# Patient Record
Sex: Male | Born: 1965 | Hispanic: No | Marital: Single | Smoking: Never smoker
Health system: Southern US, Community
[De-identification: ages and names within clinical notes are randomized; demographics above are authoritative.]

## PROBLEM LIST (undated history)

## (undated) DIAGNOSIS — G4730 Sleep apnea, unspecified: Secondary | ICD-10-CM

## (undated) DIAGNOSIS — J45909 Unspecified asthma, uncomplicated: Secondary | ICD-10-CM

## (undated) HISTORY — PX: HERNIA REPAIR: SHX51

---

## 2006-12-09 ENCOUNTER — Encounter: Admission: RE | Admit: 2006-12-09 | Discharge: 2006-12-09 | Payer: Self-pay | Admitting: Nephrology

## 2017-06-19 ENCOUNTER — Emergency Department (HOSPITAL_COMMUNITY): Payer: Self-pay

## 2017-06-19 ENCOUNTER — Encounter (HOSPITAL_COMMUNITY): Payer: Self-pay | Admitting: Emergency Medicine

## 2017-06-19 ENCOUNTER — Emergency Department (HOSPITAL_COMMUNITY)
Admission: EM | Admit: 2017-06-19 | Discharge: 2017-06-19 | Disposition: A | Discharge status: Home / Self Care | Payer: Self-pay | Attending: Emergency Medicine | Admitting: Emergency Medicine

## 2017-06-19 DIAGNOSIS — Y939 Activity, unspecified: Secondary | ICD-10-CM | POA: Insufficient documentation

## 2017-06-19 DIAGNOSIS — S61511A Laceration without foreign body of right wrist, initial encounter: Secondary | ICD-10-CM

## 2017-06-19 DIAGNOSIS — Y929 Unspecified place or not applicable: Secondary | ICD-10-CM | POA: Insufficient documentation

## 2017-06-19 DIAGNOSIS — Y990 Civilian activity done for income or pay: Secondary | ICD-10-CM | POA: Insufficient documentation

## 2017-06-19 DIAGNOSIS — S65911A Laceration of unspecified blood vessel at wrist and hand level of right arm, initial encounter: Secondary | ICD-10-CM | POA: Insufficient documentation

## 2017-06-19 DIAGNOSIS — J45909 Unspecified asthma, uncomplicated: Secondary | ICD-10-CM | POA: Insufficient documentation

## 2017-06-19 DIAGNOSIS — W268XXA Contact with other sharp object(s), not elsewhere classified, initial encounter: Secondary | ICD-10-CM | POA: Insufficient documentation

## 2017-06-19 DIAGNOSIS — Z23 Encounter for immunization: Secondary | ICD-10-CM | POA: Insufficient documentation

## 2017-06-19 HISTORY — DX: Unspecified asthma, uncomplicated: J45.909

## 2017-06-19 MED ORDER — LIDOCAINE-EPINEPHRINE (PF) 2 %-1:200000 IJ SOLN
20.0000 mL | Freq: Once | INTRAMUSCULAR | Status: AC
  Administered 2017-06-19: 20 mL via INTRADERMAL

## 2017-06-19 MED ORDER — TETANUS-DIPHTH-ACELL PERTUSSIS 5-2.5-18.5 LF-MCG/0.5 IM SUSP
0.5000 mL | Freq: Once | INTRAMUSCULAR | Status: AC
  Administered 2017-06-19: 0.5 mL via INTRAMUSCULAR
  Filled 2017-06-19: qty 0.5

## 2017-06-19 MED ORDER — OXYCODONE-ACETAMINOPHEN 5-325 MG PO TABS
1.0000 | ORAL_TABLET | Freq: Once | ORAL | Status: AC
  Administered 2017-06-19: 1 via ORAL
  Filled 2017-06-19: qty 1

## 2017-06-19 MED ORDER — LIDOCAINE-EPINEPHRINE (PF) 1 %-1:200000 IJ SOLN: 20.0000 mL | Freq: Once | INTRAMUSCULAR | Status: DC

## 2017-06-19 MED ORDER — HYDROCODONE-ACETAMINOPHEN 5-325 MG PO TABS: 1.0000 | ORAL_TABLET | Freq: Four times a day (QID) | ORAL | 0 refills | Status: AC | PRN

## 2017-06-19 NOTE — ED Provider Notes (Signed)
[facility] EMERGENCY DEPARTMENT Provider Note   CSN: [id number] Arrival date & time: 06/19/17  0033     History   Chief Complaint Chief Complaint  Patient presents with  . Laceration    HPI Aaron Arias is a 52 y.o. male.  HPI  This is a 52 year old male who presents with an injury to the right wrist.  Patient brought in by EMS with tourniquet in place.  Patient reports that he was at work when a sharp instrument used to cut rubber cut his right wrist.  He is left-handed.  EMS reports brisk bleeding from the right wrist.  Tourniquet was placed at 1 AM.  Patient is currently complaining of pain and numbness in the hand.  Denies any other injury.  Unknown last tetanus shot.  Past Medical History:  Diagnosis Date  . Asthma     There are no active problems to display for this patient.   Past Surgical History:  Procedure Laterality Date  . HERNIA REPAIR         Home Medications    Prior to Admission medications   Medication Sig Start Date End Date Taking? Authorizing Provider  HYDROcodone-acetaminophen (NORCO/VICODIN) 5-325 MG tablet Take 1 tablet by mouth every 6 (six) hours as needed. 06/19/17   Azlan Hanway, Mayer Masker, MD    Family History No family history on file.  Social History Social History   Tobacco Use  . Smoking status: Never Smoker  . Smokeless tobacco: Never Used  Substance Use Topics  . Alcohol use: No    Frequency: Never  . Drug use: No     Allergies   Patient has no known allergies.   Review of Systems Review of Systems  Musculoskeletal:       Right wrist pain  Skin: Positive for wound.  Neurological: Positive for numbness. Negative for weakness.  All other systems reviewed and are negative.    Physical Exam Updated Vital Signs BP 110/77   Pulse 76   Temp 98.4 F (36.9 C) (Oral)   Resp 18   Ht 6\' 4"  (1.93 m)   Wt (!) 149.7 kg (330 lb)   SpO2 98%   BMI 40.17 kg/m   Physical Exam  Constitutional: He is  oriented to person, place, and time. He appears well-developed and well-nourished.  Obese, no acute distress  HENT:  Head: Normocephalic and atraumatic.  Cardiovascular: Normal rate and regular rhythm.  Pulmonary/Chest: Effort normal. No respiratory distress.  Musculoskeletal: He exhibits no edema.  Focused examination of the right wrist reveals a 10 cm laceration over the palmar aspect of the wrist, tourniquet in place in the mid forearm with blue discoloration distally, oozing noted from the distal aspect of the wound, no pulsation noted, normal flexion and extension is at the risk and all 5 fingers, sensation to light touch intact  Neurological: He is alert and oriented to person, place, and time.  Skin: Skin is warm and dry.  Psychiatric: He has a normal mood and affect.  Nursing note and vitals reviewed.    ED Treatments / Results  Labs (all labs ordered are listed, but only abnormal results are displayed) Labs Reviewed - No data to display  EKG  EKG Interpretation None       Radiology Dg Wrist Complete Right  Result Date: 06/19/2017 CLINICAL DATA:  Right wrist injury with laceration. EXAM: RIGHT WRIST - COMPLETE 3+ VIEW COMPARISON:  None. FINDINGS: There is no evidence of fracture or dislocation. The alignment  and joint spaces are maintained. Degenerative cystic change in the scaphoid. Possible carpal boss, incidental. Laceration noted about the volar radial wrist with skin defect and edema. No radiopaque foreign body. IMPRESSION: Soft tissue laceration without radiopaque foreign body or acute osseous abnormality. Electronically Signed   By: Rubye OaksMelanie  Ehinger M.D.   On: 06/19/2017 01:34    Procedures .Marland Kitchen.Laceration Repair Date/Time: 06/19/2017 2:01 AM Performed by: Shon BatonHorton, Kyzer Blowe F, MD Authorized by: Shon BatonHorton, Alpheus Stiff F, MD   Consent:    Consent obtained:  Verbal   Consent given by:  Patient   Risks discussed:  Infection, pain, need for additional repair, nerve damage and  vascular damage   Alternatives discussed:  No treatment Anesthesia (see MAR for exact dosages):    Anesthesia method:  Local infiltration   Local anesthetic:  Lidocaine 2% WITH epi Laceration details:    Location:  Hand   Hand location:  R wrist   Length (cm):  10   Depth (mm):  3 Repair type:    Repair type:  Complex Pre-procedure details:    Preparation:  Patient was prepped and draped in usual sterile fashion and imaging obtained to evaluate for foreign bodies Exploration:    Limited defect created (wound extended): no     Hemostasis achieved with:  Epinephrine, tied off vessels, direct pressure and tourniquet   Wound exploration: wound explored through full range of motion and entire depth of wound probed and visualized     Wound extent: vascular damage     Wound extent: no muscle damage noted, no nerve damage noted and no tendon damage noted     Contaminated: no   Treatment:    Area cleansed with:  Betadine and saline   Amount of cleaning:  Extensive   Irrigation solution:  Sterile saline   Irrigation volume:  1 L   Irrigation method:  Pressure wash   Visualized foreign bodies/material removed: no     Debridement:  None   Undermining:  None   Scar revision: no   Skin repair:    Repair method:  Sutures   Suture size:  4-0   Suture material:  Nylon   Suture technique:  Simple interrupted   Number of sutures:  9 Approximation:    Approximation:  Close   Vermilion border: well-aligned   Post-procedure details:    Dressing: Pressure dressing.   Patient tolerance of procedure:  Tolerated well, no immediate complications Comments:     Bleeders ligated both distally and proximally in the wound, likely venous, ligated with 3-0 Vicryl figure-of-eight stitch x2, skin repair as above   (including critical care time)  Medications Ordered in ED Medications  lidocaine-EPINEPHrine (XYLOCAINE W/EPI) 2 %-1:200000 (PF) injection 20 mL (20 mLs Intradermal Given 06/19/17 0045)  Tdap  (BOOSTRIX) injection 0.5 mL (0.5 mLs Intramuscular Given 06/19/17 0127)  oxyCODONE-acetaminophen (PERCOCET/ROXICET) 5-325 MG per tablet 1 tablet (1 tablet Oral Given 06/19/17 0127)     Initial Impression / Assessment and Plan / ED Course  I have reviewed the triage vital signs and the nursing notes.  Pertinent labs & imaging results that were available during my care of the patient were reviewed by me and considered in my medical decision making (see chart for details).     Patient presents with an injury to the right wrist.  Tourniquet in place.  He does have some brisk what appears to be venous bleeding distally in the wound.  This was ligated with good bleeding control.  Lidocaine with epinephrine  was also injected.  Tourniquet was taken down and no additional bleeding was noted from the wound.  Wound was probed and explored without any evidence of tendon rupture or damage.  Patient has good neurovascular exam distally.  Ulnar and radial pulses are intact.  Venous bleeder appeared to be more in the midline.  See wound repair above.  On repeat examination, no recurrent bleeding noted through the bandage.  Recommend hand follow-up.  Patient is reporting some numbness in the fourth and fifth digits but continues to have good strength, flexion, extension, and capillary refill.  After history, exam, and medical workup I feel the patient has been appropriately medically screened and is safe for discharge home. Pertinent diagnoses were discussed with the patient. Patient was given return precautions.   Final Clinical Impressions(s) / ED Diagnoses   Final diagnoses:  Laceration of right wrist, initial encounter  Laceration of unspecified blood vessel at wrist and hand level of right arm, initial encounter    ED Discharge Orders        Ordered    HYDROcodone-acetaminophen (NORCO/VICODIN) 5-325 MG tablet  Every 6 hours PRN     06/19/17 0156       Shon Baton, MD 06/19/17 0205

## 2017-06-19 NOTE — ED Notes (Signed)
E-signature not available, pt verbalized understanding of DC instructions and prescriptions 

## 2017-06-19 NOTE — ED Triage Notes (Signed)
BIB EMS from work, pt cut R wrist on piece of metal scraper. Laceration approx 4 in, tourniquet in place. Per EMS bleeding was arterial. After EDP evaluation, bleeding appears to be venous. Received 200NS and 100 Fentanyl en route. VSS, A/OX4.

## 2017-06-19 NOTE — Discharge Instructions (Signed)
You were seen today with a laceration of the left wrist.  You had a vessel that was tied off.  You need to follow-up with hand surgery for repeat examination and suture removal.  Try to make an appointment before you leave for [place].

## 2017-06-19 NOTE — ED Notes (Signed)
EDP at bedside preparing to suture the active bleeding

## 2017-06-24 ENCOUNTER — Ambulatory Visit (HOSPITAL_COMMUNITY)
Admission: RE | Admit: 2017-06-24 | Discharge: 2017-06-24 | Disposition: A | Discharge status: Home / Self Care | Payer: Self-pay | Source: Ambulatory Visit | Attending: Orthopedic Surgery | Admitting: Orthopedic Surgery

## 2017-06-24 ENCOUNTER — Encounter (HOSPITAL_COMMUNITY): Payer: Self-pay | Admitting: Certified Registered"

## 2017-06-24 ENCOUNTER — Ambulatory Visit (HOSPITAL_COMMUNITY): Payer: Self-pay | Admitting: Certified Registered"

## 2017-06-24 ENCOUNTER — Encounter (HOSPITAL_COMMUNITY)
Admission: RE | Disposition: A | Discharge status: Home / Self Care | Payer: Self-pay | Source: Ambulatory Visit | Attending: Orthopedic Surgery

## 2017-06-24 DIAGNOSIS — S66124A Laceration of flexor muscle, fascia and tendon of right ring finger at wrist and hand level, initial encounter: Secondary | ICD-10-CM | POA: Insufficient documentation

## 2017-06-24 DIAGNOSIS — S66126A Laceration of flexor muscle, fascia and tendon of right little finger at wrist and hand level, initial encounter: Secondary | ICD-10-CM | POA: Insufficient documentation

## 2017-06-24 DIAGNOSIS — S61501A Unspecified open wound of right wrist, initial encounter: Secondary | ICD-10-CM

## 2017-06-24 DIAGNOSIS — S6401XA Injury of ulnar nerve at wrist and hand level of right arm, initial encounter: Secondary | ICD-10-CM | POA: Insufficient documentation

## 2017-06-24 DIAGNOSIS — G4730 Sleep apnea, unspecified: Secondary | ICD-10-CM | POA: Insufficient documentation

## 2017-06-24 DIAGNOSIS — X58XXXA Exposure to other specified factors, initial encounter: Secondary | ICD-10-CM | POA: Insufficient documentation

## 2017-06-24 DIAGNOSIS — S61511A Laceration without foreign body of right wrist, initial encounter: Secondary | ICD-10-CM | POA: Insufficient documentation

## 2017-06-24 DIAGNOSIS — Y990 Civilian activity done for income or pay: Secondary | ICD-10-CM | POA: Insufficient documentation

## 2017-06-24 DIAGNOSIS — S66921A Laceration of unspecified muscle, fascia and tendon at wrist and hand level, right hand, initial encounter: Secondary | ICD-10-CM

## 2017-06-24 DIAGNOSIS — Z6841 Body Mass Index (BMI) 40.0 and over, adult: Secondary | ICD-10-CM | POA: Insufficient documentation

## 2017-06-24 DIAGNOSIS — S65011A Laceration of ulnar artery at wrist and hand level of right arm, initial encounter: Secondary | ICD-10-CM | POA: Insufficient documentation

## 2017-06-24 DIAGNOSIS — Z888 Allergy status to other drugs, medicaments and biological substances status: Secondary | ICD-10-CM | POA: Insufficient documentation

## 2017-06-24 HISTORY — PX: FLEXOR TENDON REPAIR: SHX6501

## 2017-06-24 HISTORY — DX: Sleep apnea, unspecified: G47.30

## 2017-06-24 SURGERY — REPAIR, TENDON, FLEXOR
Anesthesia: General | Site: Wrist | Laterality: Right

## 2017-06-24 MED ORDER — OXYCODONE HCL 5 MG PO TABS
ORAL_TABLET | ORAL | Status: AC
  Filled 2017-06-24: qty 2

## 2017-06-24 MED ORDER — MIDAZOLAM HCL 2 MG/2ML IJ SOLN
INTRAMUSCULAR | Status: DC | PRN
  Administered 2017-06-24: 2 mg via INTRAVENOUS

## 2017-06-24 MED ORDER — LIDOCAINE HCL (CARDIAC) 20 MG/ML IV SOLN
INTRAVENOUS | Status: AC
  Filled 2017-06-24: qty 5

## 2017-06-24 MED ORDER — PROPOFOL 10 MG/ML IV BOLUS
INTRAVENOUS | Status: DC | PRN
  Administered 2017-06-24: 200 mg via INTRAVENOUS

## 2017-06-24 MED ORDER — ACETAMINOPHEN 325 MG PO TABS
325.0000 mg | ORAL_TABLET | Freq: Once | ORAL | Status: AC
  Administered 2017-06-24: 325 mg via ORAL

## 2017-06-24 MED ORDER — HYDROCODONE-ACETAMINOPHEN 7.5-325 MG PO TABS: 1.0000 | ORAL_TABLET | Freq: Once | ORAL | Status: DC | PRN

## 2017-06-24 MED ORDER — SODIUM CHLORIDE 0.9 % IV SOLN
INTRAVENOUS | Status: DC | PRN
  Administered 2017-06-24: 500 mL

## 2017-06-24 MED ORDER — FENTANYL CITRATE (PF) 250 MCG/5ML IJ SOLN
INTRAMUSCULAR | Status: AC
  Filled 2017-06-24: qty 5

## 2017-06-24 MED ORDER — PROPOFOL 10 MG/ML IV BOLUS
INTRAVENOUS | Status: AC
  Filled 2017-06-24: qty 20

## 2017-06-24 MED ORDER — LIDOCAINE HCL (CARDIAC) 20 MG/ML IV SOLN
INTRAVENOUS | Status: DC | PRN
  Administered 2017-06-24: 100 mg via INTRATRACHEAL

## 2017-06-24 MED ORDER — ACETAMINOPHEN 325 MG PO TABS
ORAL_TABLET | ORAL | Status: AC
  Filled 2017-06-24: qty 1

## 2017-06-24 MED ORDER — DEXAMETHASONE SODIUM PHOSPHATE 10 MG/ML IJ SOLN
INTRAMUSCULAR | Status: DC | PRN
  Administered 2017-06-24: 10 mg via INTRAVENOUS

## 2017-06-24 MED ORDER — BUPIVACAINE HCL (PF) 0.25 % IJ SOLN
INTRAMUSCULAR | Status: DC | PRN
  Administered 2017-06-24: 10 mL

## 2017-06-24 MED ORDER — OXYCODONE HCL 5 MG PO TABS
10.0000 mg | ORAL_TABLET | Freq: Once | ORAL | Status: AC
  Administered 2017-06-24: 10 mg via ORAL

## 2017-06-24 MED ORDER — MIDAZOLAM HCL 2 MG/2ML IJ SOLN
INTRAMUSCULAR | Status: AC
  Filled 2017-06-24: qty 2

## 2017-06-24 MED ORDER — ONDANSETRON HCL 4 MG/2ML IJ SOLN
INTRAMUSCULAR | Status: AC
  Filled 2017-06-24: qty 2

## 2017-06-24 MED ORDER — DEXAMETHASONE SODIUM PHOSPHATE 10 MG/ML IJ SOLN
INTRAMUSCULAR | Status: AC
Start: 2017-06-24 — End: 2017-06-24
  Filled 2017-06-24: qty 1

## 2017-06-24 MED ORDER — PROMETHAZINE HCL 25 MG/ML IJ SOLN: 6.2500 mg | INTRAMUSCULAR | Status: DC | PRN

## 2017-06-24 MED ORDER — 0.9 % SODIUM CHLORIDE (POUR BTL) OPTIME
TOPICAL | Status: DC | PRN
  Administered 2017-06-24: 1000 mL

## 2017-06-24 MED ORDER — CEFAZOLIN SODIUM 10 G IJ SOLR
3.0000 g | INTRAMUSCULAR | Status: AC
  Administered 2017-06-24: 3 g via INTRAVENOUS
  Filled 2017-06-24: qty 3000

## 2017-06-24 MED ORDER — BUPIVACAINE HCL (PF) 0.25 % IJ SOLN
INTRAMUSCULAR | Status: AC
Start: 2017-06-24 — End: 2017-06-24
  Filled 2017-06-24: qty 30

## 2017-06-24 MED ORDER — ONDANSETRON HCL 4 MG/2ML IJ SOLN
INTRAMUSCULAR | Status: DC | PRN
  Administered 2017-06-24: 4 mg via INTRAVENOUS

## 2017-06-24 MED ORDER — LACTATED RINGERS IV SOLN
INTRAVENOUS | Status: DC
  Administered 2017-06-24 (×2): via INTRAVENOUS

## 2017-06-24 MED ORDER — MEPERIDINE HCL 50 MG/ML IJ SOLN: 6.2500 mg | INTRAMUSCULAR | Status: DC | PRN

## 2017-06-24 MED ORDER — FENTANYL CITRATE (PF) 100 MCG/2ML IJ SOLN: 25.0000 ug | INTRAMUSCULAR | Status: DC | PRN

## 2017-06-24 MED ORDER — FENTANYL CITRATE (PF) 250 MCG/5ML IJ SOLN
INTRAMUSCULAR | Status: DC | PRN
  Administered 2017-06-24 (×2): 50 ug via INTRAVENOUS
  Administered 2017-06-24: 25 ug via INTRAVENOUS
  Administered 2017-06-24: 50 ug via INTRAVENOUS
  Administered 2017-06-24: 25 ug via INTRAVENOUS
  Administered 2017-06-24: 50 ug via INTRAVENOUS

## 2017-06-24 SURGICAL SUPPLY — 69 items
BAG DECANTER FOR FLEXI CONT (MISCELLANEOUS) ×3 IMPLANT
BANDAGE ACE 3X5.8 VEL STRL LF (GAUZE/BANDAGES/DRESSINGS) ×3 IMPLANT
BANDAGE ACE 4X5 VEL STRL LF (GAUZE/BANDAGES/DRESSINGS) ×3 IMPLANT
BLADE SURG 15 STRL LF DISP TIS (BLADE) IMPLANT
BLADE SURG 15 STRL SS (BLADE)
BNDG ESMARK 4X9 LF (GAUZE/BANDAGES/DRESSINGS) ×3 IMPLANT
BNDG GAUZE ELAST 4 BULKY (GAUZE/BANDAGES/DRESSINGS) ×3 IMPLANT
CORDS BIPOLAR (ELECTRODE) ×6 IMPLANT
COVER MAYO STAND STRL (DRAPES) ×3 IMPLANT
CUFF TOURNIQUET SINGLE 18IN (TOURNIQUET CUFF) IMPLANT
CUFF TOURNIQUET SINGLE 24IN (TOURNIQUET CUFF) ×3 IMPLANT
DECANTER SPIKE VIAL GLASS SM (MISCELLANEOUS) ×3 IMPLANT
DRAPE MICROSCOPE LEICA (MISCELLANEOUS) ×3 IMPLANT
DRAPE SURG 17X23 STRL (DRAPES) ×3 IMPLANT
DRSG ADAPTIC 3X8 NADH LF (GAUZE/BANDAGES/DRESSINGS) ×3 IMPLANT
DRSG EMULSION OIL 3X3 NADH (GAUZE/BANDAGES/DRESSINGS) ×3 IMPLANT
GAUZE SPONGE 4X4 12PLY STRL (GAUZE/BANDAGES/DRESSINGS) ×3 IMPLANT
GAUZE XEROFORM 5X9 LF (GAUZE/BANDAGES/DRESSINGS) ×3 IMPLANT
GLOVE BIOGEL PI IND STRL 8.5 (GLOVE) ×1 IMPLANT
GLOVE BIOGEL PI INDICATOR 8.5 (GLOVE) ×2
GLOVE SURG ORTHO 8.0 STRL STRW (GLOVE) ×3 IMPLANT
GOWN STRL REUS W/ TWL LRG LVL3 (GOWN DISPOSABLE) ×2 IMPLANT
GOWN STRL REUS W/ TWL XL LVL3 (GOWN DISPOSABLE) ×1 IMPLANT
GOWN STRL REUS W/TWL LRG LVL3 (GOWN DISPOSABLE) ×4
GOWN STRL REUS W/TWL XL LVL3 (GOWN DISPOSABLE) ×2
KIT BASIN OR (CUSTOM PROCEDURE TRAY) ×3 IMPLANT
LOOP VESSEL MAXI BLUE (MISCELLANEOUS) ×3 IMPLANT
LOOP VESSEL MINI RED (MISCELLANEOUS) IMPLANT
NEEDLE 22X1 1/2 (OR ONLY) (NEEDLE) ×3 IMPLANT
NEEDLE HYPO 25X1 1.5 SAFETY (NEEDLE) IMPLANT
NS IRRIG 1000ML POUR BTL (IV SOLUTION) ×3 IMPLANT
PACK ORTHO EXTREMITY (CUSTOM PROCEDURE TRAY) ×3 IMPLANT
PAD CAST 4YDX4 CTTN HI CHSV (CAST SUPPLIES) ×1 IMPLANT
PADDING CAST ABS 4INX4YD NS (CAST SUPPLIES) ×2
PADDING CAST ABS COTTON 4X4 ST (CAST SUPPLIES) ×1 IMPLANT
PADDING CAST COTTON 4X4 STRL (CAST SUPPLIES) ×2
SET CYSTO W/LG BORE CLAMP LF (SET/KITS/TRAYS/PACK) IMPLANT
SOAP 2 % CHG 4 OZ (WOUND CARE) ×3 IMPLANT
SPEAR EYE SURG WECK-CEL (MISCELLANEOUS) ×6 IMPLANT
SPLINT FIBERGLASS 3X35 (CAST SUPPLIES) ×3 IMPLANT
SUCTION FRAZIER HANDLE 10FR (MISCELLANEOUS)
SUCTION TUBE FRAZIER 10FR DISP (MISCELLANEOUS) IMPLANT
SUT ETHIBOND 3-0 V-5 (SUTURE) IMPLANT
SUT ETHILON 4 0 PS 2 18 (SUTURE) IMPLANT
SUT ETHILON 7 0 P 1 (SUTURE) ×3 IMPLANT
SUT ETHILON 8 0 BV130 4 (SUTURE) ×6 IMPLANT
SUT ETHILON 9 0 BV130 4 (SUTURE) IMPLANT
SUT FIBER WIRE 4.0 (SUTURE) ×6 IMPLANT
SUT FIBERWIRE 2-0 18 17.9 3/8 (SUTURE)
SUT FIBERWIRE 3-0 18 DIAM 3/8 (SUTURE) ×9
SUT FIBERWIRE 3-0 18 TAPR NDL (SUTURE)
SUT MERSILENE 4 0 P 3 (SUTURE) IMPLANT
SUT PROLENE 3 0 PS 2 (SUTURE) ×12 IMPLANT
SUT PROLENE 4 0 PS 2 18 (SUTURE) IMPLANT
SUT PROLENE 5 0 PS 2 (SUTURE) IMPLANT
SUT PROLENE 6 0 P 1 18 (SUTURE) IMPLANT
SUT PROLENE 7 0 P 1 (SUTURE) ×6 IMPLANT
SUT VIC AB 2-0 SH 27 (SUTURE)
SUT VIC AB 2-0 SH 27XBRD (SUTURE) IMPLANT
SUT VICRYL 4-0 PS2 18IN ABS (SUTURE) ×3 IMPLANT
SUTURE FIBERWR 2-0 18 17.9 3/8 (SUTURE) IMPLANT
SUTURE FIBERWR 3-0 18 DIAM 3/8 (SUTURE) ×3 IMPLANT
SUTURE FIBERWR 3-0 18 TAPR NDL (SUTURE) IMPLANT
SYR BULB 3OZ (MISCELLANEOUS) ×3 IMPLANT
SYR CONTROL 10ML LL (SYRINGE) ×3 IMPLANT
TUBE CONNECTING 12'X1/4 (SUCTIONS)
TUBE CONNECTING 12X1/4 (SUCTIONS) IMPLANT
UNDERPAD 30X30 (UNDERPADS AND DIAPERS) ×3 IMPLANT
WATER STERILE IRR 1000ML POUR (IV SOLUTION) ×3 IMPLANT

## 2017-06-24 NOTE — Anesthesia Procedure Notes (Signed)
Procedure Name: LMA Insertion Date/Time: 06/24/2017 4:42 PM Performed by: Rosiland OzMeyers, Gracianna Vink, CRNA Pre-anesthesia Checklist: Patient identified, Emergency Drugs available, Suction available, Patient being monitored and Timeout performed Patient Re-evaluated:Patient Re-evaluated prior to induction Oxygen Delivery Method: Circle system utilized Preoxygenation: Pre-oxygenation with 100% oxygen Induction Type: IV induction LMA: LMA inserted LMA Size: 5.0 Number of attempts: 1 Placement Confirmation: positive ETCO2 and breath sounds checked- equal and bilateral Tube secured with: Tape Dental Injury: Teeth and Oropharynx as per pre-operative assessment

## 2017-06-24 NOTE — Discharge Instructions (Signed)
KEEP BANDAGE CLEAN AND DRY °CALL OFFICE FOR F/U APPT [phone] in 14 days °KEEP HAND ELEVATED ABOVE HEART °OK TO APPLY ICE TO OPERATIVE AREA °CONTACT OFFICE IF ANY WORSENING PAIN OR CONCERNS. °

## 2017-06-24 NOTE — Anesthesia Postprocedure Evaluation (Signed)
Anesthesia Post Note  Patient: Aaron LairDaniel Gerety  Procedure(s) Performed: RIGHT WRIST LACERATION WITH WOUND EXPORORATION AND REPAIR AS INDICATED (Right Wrist)     Patient location during evaluation: PACU Anesthesia Type: General Level of consciousness: awake and alert Pain management: pain level controlled Vital Signs Assessment: post-procedure vital signs reviewed and stable Respiratory status: spontaneous breathing, nonlabored ventilation, respiratory function stable and patient connected to nasal cannula oxygen Cardiovascular status: blood pressure returned to baseline and stable Postop Assessment: no apparent nausea or vomiting Anesthetic complications: no    Last Vitals:  Vitals:   06/24/17 1932 06/24/17 1945  BP: 129/83 132/89  Pulse: 88 73  Resp: 19 19  Temp:  36.6 C  SpO2: 93% 94%    Last Pain:  Vitals:   06/24/17 1945  PainSc: 4                  Kennieth RadFitzgerald, Tiffanie Blassingame E

## 2017-06-24 NOTE — Op Note (Signed)
PREOPERATIVE DIAGNOSIS: Right wrist laceration with tendon and nerve involvement and artery involvement  POSTOPERATIVE DIAGNOSIS: Right wrist laceration with tendon and nerve and artery involvement  ATTENDING SURGEON: Dr. Bradly BienenstockFred Shiraz Bastyr who was scrubbed and present for the entire procedure  ASSISTANT SURGEON: Lambert ModySamantha Barton PA-C was scrubbed and necessary for time under the microscope tendon repair nerve repair splinting and closure  ANESTHESIA: Gen. via LMA  OPERATIVE PROCEDURE: #1: Right wrist ulnar artery repair, peripheral artery repair #2: Right wrist ulnar nerve repair, peripheral nerve repair at the level of the wrist #3: Microscope use #4: Right wrist flexor digitorum superficialis to the ring finger repair #5: Right wrist flexor digitorum superficialis to the small finger repair #6: Right wrist median nerve exploration and decompression, neuroplasty at the level of the wrist #7: Traumatic laceration 6 cm right wrist  IMPLANTS: None  RADIOGRAPHIC INTERPRETATION: None  SURGICAL INDICATIONS: Patient is a right-hand-dominant gentleman who sustained the traumatic injury at work. Patient was seen and evaluated in the office today and recommended undergo the above procedure. Risks benefits and alternatives were discussed in detail with the patient and signed informed consent was obtained. Risks include but not limited to bleeding infection damage to nearby nerves arteries or tendons loss of motion of the wrists and digits incomplete relief of symptoms and need for further surgical intervention.  SURGICAL TECHNIQUE: Patient is properly identified in the preoperative holding area and a mark with a permanent marker made on the right wrist indicate correct operative site. Patient brought back Room placed supine on anesthesia and table general anesthesia was administered. Patient tolerated this well. Preoperative and blacks given prior to any skin incision. A well-padded tourniquet was then  placed on the right brachium and sealed with the appropriate drape. The right upper extremity was prepped and draped in normal sterile fashion. Timeout was called the correct site was identified and the procedure then begun. The 6 cm laceration was then extended proximally. Dissection carried down through the skin and subcutaneous tissue. Moving from an ulnar to radial direction the flexor carpi ulnaris was intact. The patient did have a partial laceration to the volar surface of the ulnar nerve. The dorsal surface of the nerve was in continuity. The patient had a complete transection of the ulnar artery. Complete transection of the FDS to the ring and small fingers. Ring finger FDP and small finger FDP were in continuity. Long finger FDS and FDP were in continuity. Index finger FDP and index finger FDS were in continuity. Careful dissection was then carried out of the median nerve in the median nerve was then carefully this of dissected free. Neuroplasty then done and the nerve was then inspected and was free of any laceration. The wound was then thoroughly irrigated. Attention was then turned to the repairs. The tendons were then repair first. The FDS to the ring and small fingers were then repaired with 40 and 3-0 FiberWire. Core modified Kessler and core horizontal mattress sutures were then used as well as a running locking 4-0 FiberWire suture. The wounds were then irrigated. The tourniquet deflated. The vessels were then identified proximally and distally of the ulnar artery. Thick clot both proximally and distally was then evacuated good bleeding from both ends of the vessel. Small vessel clamps were then applied. Under the microscope vessel was then repaired under microscopic aid with Prolene suture. There is good repair the vessel. The clamps were then released and there is good proximal and back flow through the vessel. Following this  careful dissection was then carried out to identify the ulnar nerve.  The nerve was then carefully dissected free the superficial laceration not extending all the way through the nerve was then repaired with 80 epineurial sutures. 3 epineurial sutures in place tacking the epineurium back in this location lining the the blood vessels up along the epineurium. After repair of the nerve wound was then irrigated. The skin and traumatic laceration was then closed with simple Prolene suture. 10 mL accord percent Marcaine infiltrated locally. Adaptic dressing a sterile compressive bandage then applied. Patient was then placed in well-padded dorsal and volar splint and extubated taken recovery room in good condition.  POSTOPERATIVE PLAN Patient be discharged to home. He is going to [place] tomorrow visit his family. He did not want stay overnight. He'll be seen back now office when he returns from [place] Keep the bandage on at all times. We'll set up an outpatient therapy regimen when he returns from [place] For zone 5 flexor tendon repair protocol. :

## 2017-06-24 NOTE — Anesthesia Preprocedure Evaluation (Addendum)
Anesthesia Evaluation  Patient identified by MRN, date of birth, ID band Patient awake    Reviewed: Allergy & Precautions, NPO status , Patient's Chart, lab work & pertinent test results  Airway Mallampati: II  TM Distance: >3 FB Neck ROM: Full    Dental no notable dental hx. (+) Teeth Intact, Missing, Chipped,    Pulmonary asthma ,    Pulmonary exam normal breath sounds clear to auscultation       Cardiovascular negative cardio ROS Normal cardiovascular exam Rhythm:Regular Rate:Normal     Neuro/Psych negative neurological ROS  negative psych ROS   GI/Hepatic negative GI ROS, Neg liver ROS,   Endo/Other  Morbid obesity  Renal/GU Hx/o renal calculi  negative genitourinary   Musculoskeletal Laceration of right wrist with tendon and nerve involvement   Abdominal (+) + obese,   Peds  Hematology   Anesthesia Other Findings   Reproductive/Obstetrics                            Anesthesia Physical Anesthesia Plan  ASA: III  Anesthesia Plan: General   Post-op Pain Management:    Induction: Intravenous  PONV Risk Score and Plan: 4 or greater  Airway Management Planned: LMA  Additional Equipment:   Intra-op Plan:   Post-operative Plan: Extubation in OR  Informed Consent: I have reviewed the patients History and Physical, chart, labs and discussed the procedure including the risks, benefits and alternatives for the proposed anesthesia with the patient or authorized representative who has indicated his/her understanding and acceptance.   Dental advisory given  Plan Discussed with: CRNA, Anesthesiologist and Surgeon  Anesthesia Plan Comments:         Anesthesia Quick Evaluation

## 2017-06-24 NOTE — H&P (Signed)
Aaron LairDaniel Hallisey is an 52 y.o. male.   Chief Complaint: RIGHT WRIST LACERATION HPI:  PT SUSTAINED WORK RELATED INJURY TRANSVERSE LACERATION TO RIGHT WRIST REGION PT HERE FOR SURGERY ON RIGHT WRIST NO PRIOR SURGERY TO RIGHT WRIST PT SEEN/EVALUATED IN OFFICE TODAY  Past Medical History:  Diagnosis Date  . Asthma   . Sleep apnea     Past Surgical History:  Procedure Laterality Date  . HERNIA REPAIR      History reviewed. No pertinent family history. Social History:  reports that  has never smoked. he has never used smokeless tobacco. He reports that he does not drink alcohol or use drugs.  Allergies:  Allergies  Allergen Reactions  . Chlorhexidine Gluconate Itching    Medications Prior to Admission  Medication Sig Dispense Refill  . acetaminophen (TYLENOL) 500 MG tablet Take 500 mg by mouth every 6 (six) hours as needed for mild pain or moderate pain.    Marland Kitchen. HYDROcodone-acetaminophen (NORCO/VICODIN) 5-325 MG tablet Take 1 tablet by mouth every 6 (six) hours as needed. 6 tablet 0  . ibuprofen (ADVIL,MOTRIN) 200 MG tablet Take 200 mg by mouth every 6 (six) hours as needed.      No results found for this or any previous visit (from the past 48 hour(s)). No results found.  ROS  NO RECENT ILLNESSES OR HOSPITALIZATIONS  Height 6' 3.98" (1.93 m), weight (!) 330 lb (149.7 kg). Physical Exam   General Appearance:  Alert, cooperative, no distress, appears stated age  Head:  Normocephalic, without obvious abnormality, atraumatic  Eyes:  Pupils equal, conjunctiva/corneas clear,         Throat: Lips, mucosa, and tongue normal; teeth and gums normal  Neck: No visible masses     Lungs:   respirations unlabored  Chest Wall:  No tenderness or deformity  Heart:  Regular rate and rhythm,  Abdomen:   Soft, non-tender,         Extremities: RIGHT WRIST: SKIN INTACT, SUTURES IN PLACE FINGERS WARM WELL PERFUSED WIGGLES FINGERS LIMITED WRIST MOTION  Pulses: 2+ and symmetric  Skin: Skin  color, texture, turgor normal, no rashes or lesions     Neurologic: Normal    Assessment/Plan RIGHT WRIST LACERATION WITH NERVE/TENDON POSSIBLE ARTERY INVOLVEMENT  RIGHT WRIST EXPLORATION AND REPAIR AS INDICATED  R/B/A DISCUSSED WITH PT IN OFFICE.  PT VOICED UNDERSTANDING OF PLAN CONSENT SIGNED DAY OF SURGERY PT SEEN AND EXAMINED PRIOR TO OPERATIVE PROCEDURE/DAY OF SURGERY SITE MARKED. QUESTIONS ANSWERED WILL GO HOME FOLLOWING SURGERY  WE ARE PLANNING SURGERY FOR YOUR UPPER EXTREMITY. THE RISKS AND BENEFITS OF SURGERY INCLUDE BUT NOT LIMITED TO BLEEDING INFECTION, DAMAGE TO NEARBY NERVES ARTERIES TENDONS, FAILURE OF SURGERY TO ACCOMPLISH ITS INTENDED GOALS, PERSISTENT SYMPTOMS AND NEED FOR FURTHER SURGICAL INTERVENTION. WITH THIS IN MIND WE WILL PROCEED. I HAVE DISCUSSED WITH THE PATIENT THE PRE AND POSTOPERATIVE REGIMEN AND THE DOS AND DON'TS. PT VOICED UNDERSTANDING AND INFORMED CONSENT SIGNED.  Thomasene RippleFred W [facility] 06/24/2017, 4:23 PM

## 2017-06-24 NOTE — Transfer of Care (Signed)
Immediate Anesthesia Transfer of Care Note  Patient: Aaron LairDaniel Arias  Procedure(s) Performed: RIGHT WRIST LACERATION WITH WOUND EXPORORATION AND REPAIR AS INDICATED (Right Wrist)  Patient Location: PACU  Anesthesia Type:General  Level of Consciousness: sedated and responds to stimulation  Airway & Oxygen Therapy: Patient Spontanous Breathing and Patient connected to nasal cannula oxygen  Post-op Assessment: Report given to RN and Post -op Vital signs reviewed and stable  Post vital signs: Reviewed and stable  Last Vitals: There were no vitals filed for this visit.  Last Pain: There were no vitals filed for this visit.       Complications: No apparent anesthesia complications

## 2017-06-25 ENCOUNTER — Encounter (HOSPITAL_COMMUNITY): Payer: Self-pay | Admitting: Orthopedic Surgery

## 2019-11-27 IMAGING — DX DG WRIST COMPLETE 3+V*R*
4 series · 4 of 4 positions shown · non-contrast
Comparison: None.

CLINICAL DATA: Right wrist injury with laceration.

EXAM:
RIGHT WRIST - COMPLETE 3+ VIEW

[wrist ap (1 of 2)]
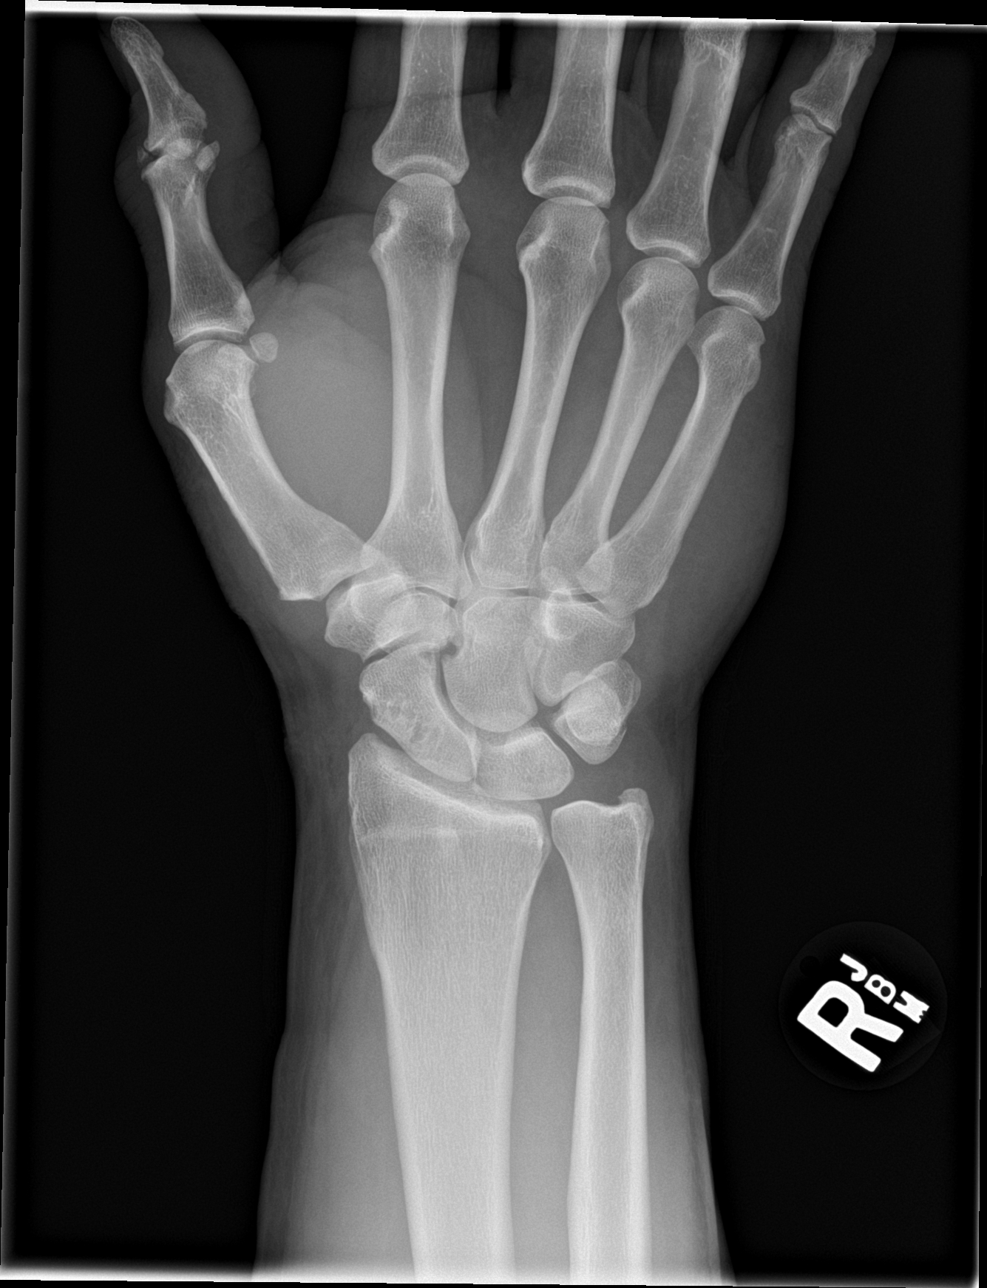

[wrist obl]
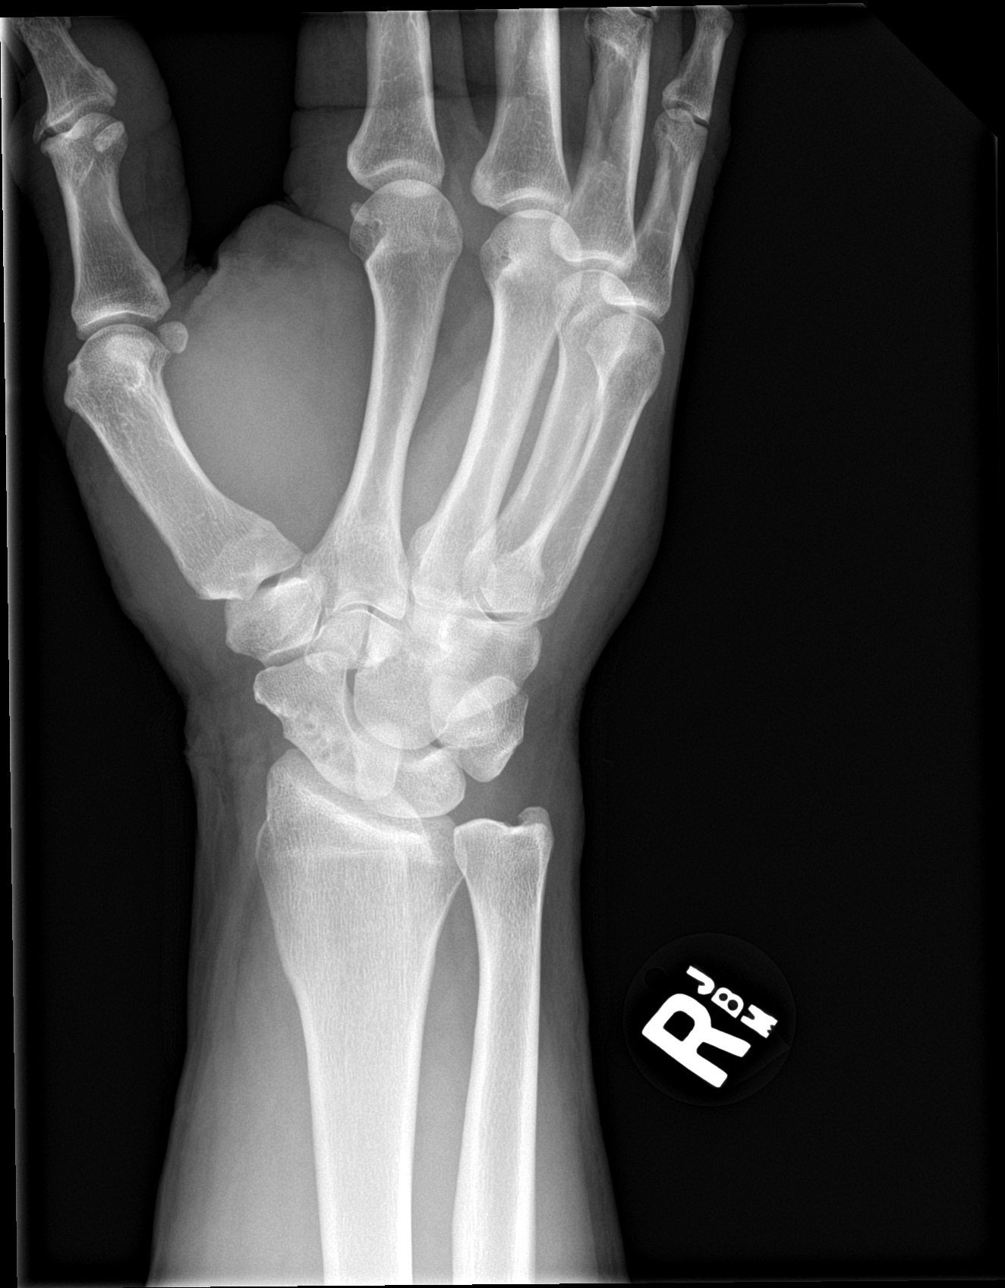

[wrist lat]
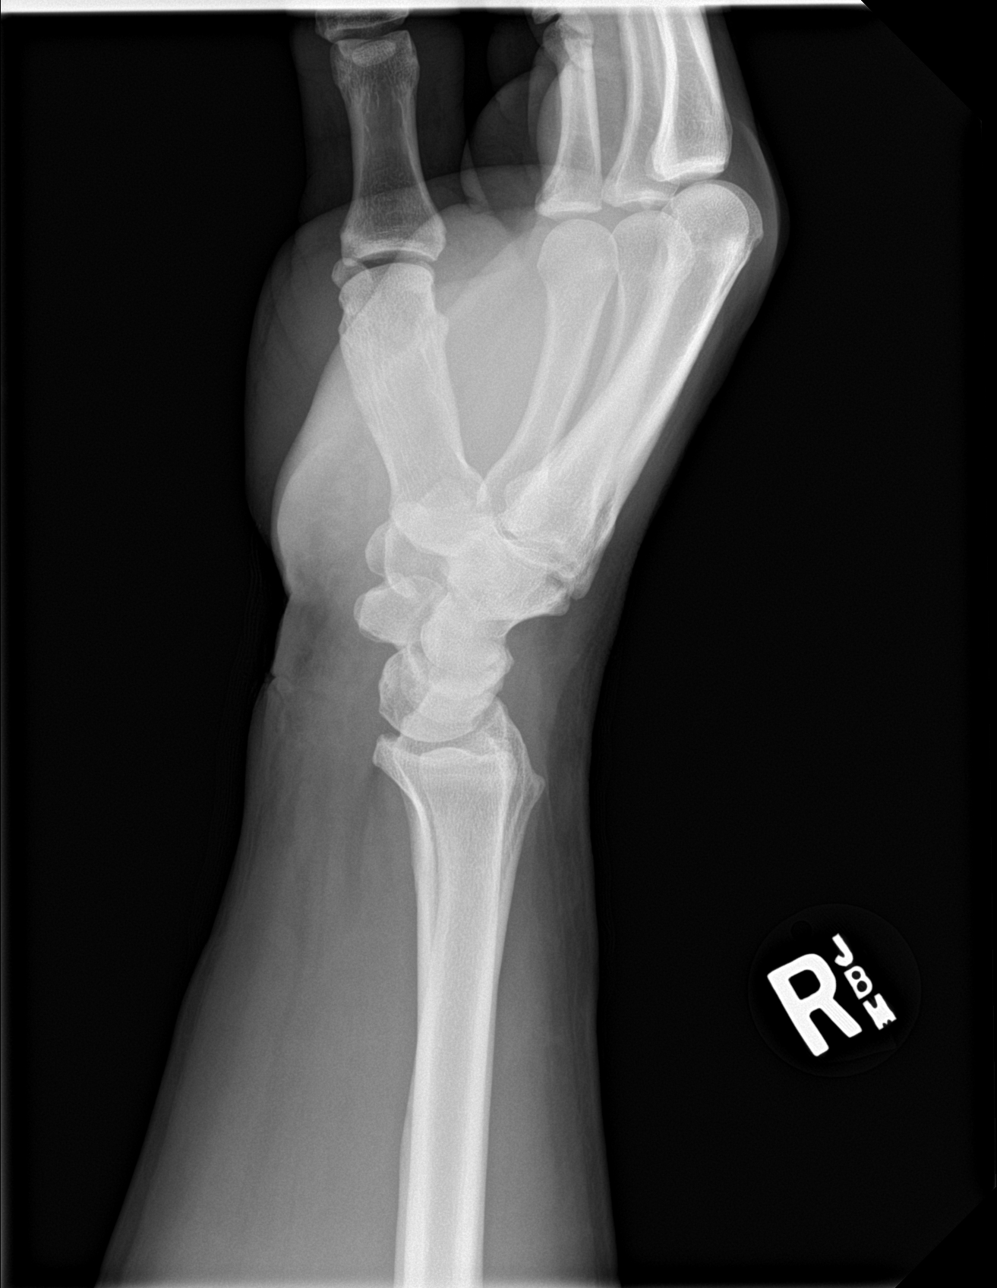

[wrist ap (2 of 2)]
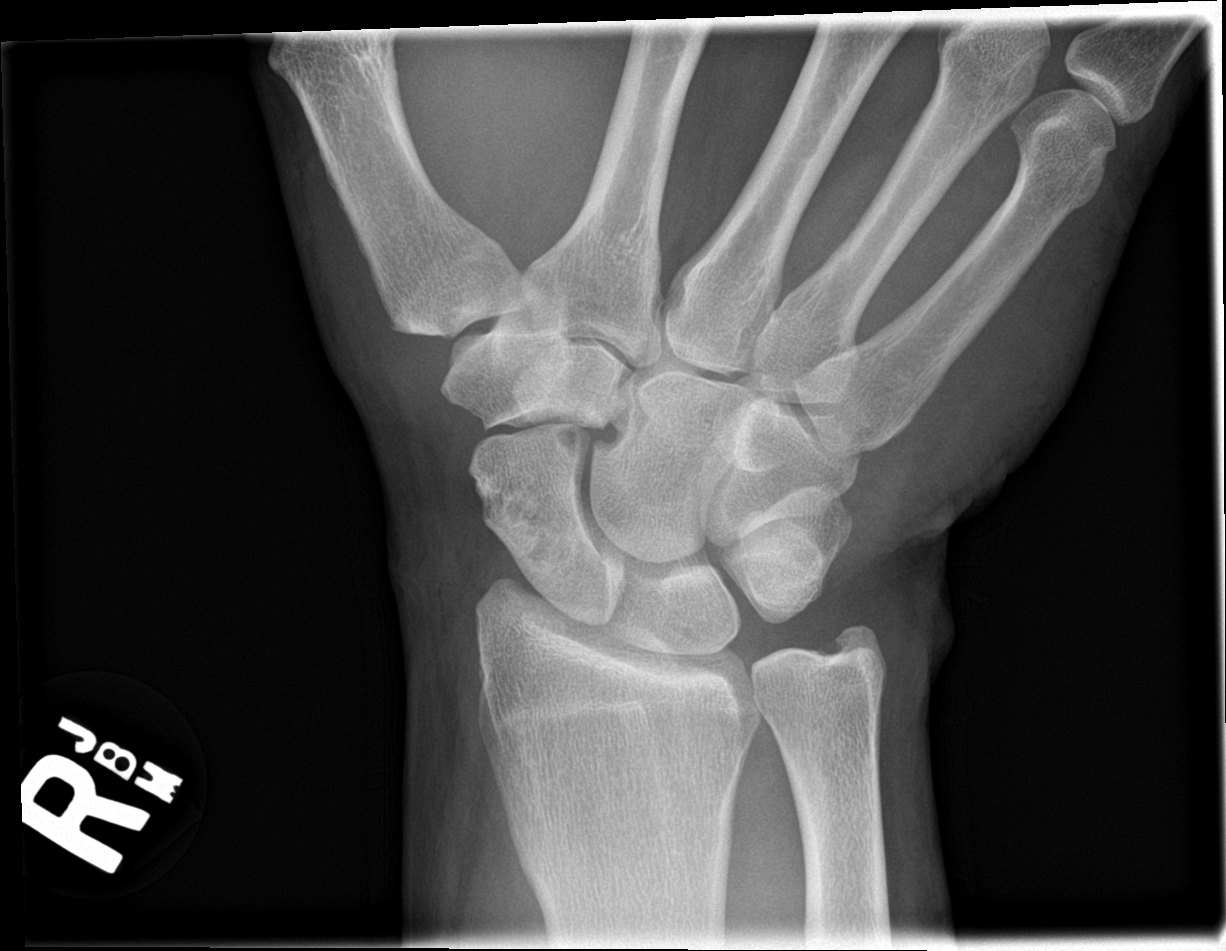

[4 of 4 positions shown; findings below may reference images not displayed]

FINDINGS: There is no evidence of fracture or dislocation. The alignment and
joint spaces are maintained. Degenerative cystic change in the
scaphoid. Possible [REDACTED], incidental. Laceration noted about
the volar radial wrist with skin defect and edema. No radiopaque
foreign body.
IMPRESSION: Soft tissue laceration without radiopaque foreign body or acute
osseous abnormality.
